# Patient Record
Sex: Male | Born: 1995 | Race: Asian | Hispanic: No | Marital: Married | Smoking: Never smoker
Health system: Southern US, Community
[De-identification: ages and names within clinical notes are randomized; demographics above are authoritative.]

---

## 2021-01-08 ENCOUNTER — Ambulatory Visit (INDEPENDENT_AMBULATORY_CARE_PROVIDER_SITE_OTHER): Payer: 59

## 2021-01-08 ENCOUNTER — Encounter (HOSPITAL_COMMUNITY): Payer: Self-pay | Admitting: Emergency Medicine

## 2021-01-08 ENCOUNTER — Ambulatory Visit (HOSPITAL_COMMUNITY)
Admission: EM | Admit: 2021-01-08 | Discharge: 2021-01-08 | Disposition: A | Discharge status: Home / Self Care | Payer: 59 | Attending: Medical Oncology | Admitting: Medical Oncology

## 2021-01-08 DIAGNOSIS — M79661 Pain in right lower leg: Secondary | ICD-10-CM

## 2021-01-08 DIAGNOSIS — M79662 Pain in left lower leg: Secondary | ICD-10-CM

## 2021-01-08 DIAGNOSIS — M25561 Pain in right knee: Secondary | ICD-10-CM

## 2021-01-08 LAB — BASIC METABOLIC PANEL
Anion gap: 12 (ref 5–15)
BUN: 16 mg/dL (ref 6–20)
CO2: 22 mmol/L (ref 22–32)
Calcium: 9.3 mg/dL (ref 8.9–10.3)
Chloride: 103 mmol/L (ref 98–111)
Creatinine, Ser: 0.78 mg/dL (ref 0.61–1.24)
GFR, Estimated: >60 mL/min (ref >60)
Glucose, Bld: 97 mg/dL (ref 70–99)
Potassium: 4 mmol/L (ref 3.5–5.1)
Sodium: 137 mmol/L (ref 135–145)

## 2021-01-08 LAB — CBC WITH DIFFERENTIAL/PLATELET
Abs Immature Granulocytes: 0 10*3/uL (ref 0.00–0.07)
Basophils Absolute: 0 10*3/uL (ref 0.0–0.1)
Basophils Relative: 1 %
Eosinophils Absolute: 0.1 10*3/uL (ref 0.0–0.5)
Eosinophils Relative: 1 %
HCT: 43.1 % (ref 39.0–52.0)
Hemoglobin: 13.7 g/dL (ref 13.0–17.0)
Immature Granulocytes: 0 %
Lymphocytes Relative: 34 %
Lymphs Abs: 2.3 10*3/uL (ref 0.7–4.0)
MCH: 25.9 pg — ABNORMAL LOW (ref 26.0–34.0)
MCHC: 31.8 g/dL (ref 30.0–36.0)
MCV: 81.6 fL (ref 80.0–100.0)
Monocytes Absolute: 0.7 10*3/uL (ref 0.1–1.0)
Monocytes Relative: 10 %
Neutro Abs: 3.6 10*3/uL (ref 1.7–7.7)
Neutrophils Relative %: 54 %
Platelets: 250 10*3/uL (ref 150–400)
RBC: 5.28 MIL/uL (ref 4.22–5.81)
RDW: 13.2 % (ref 11.5–15.5)
WBC: 6.6 10*3/uL (ref 4.0–10.5)
nRBC: 0 % (ref 0.0–0.2)

## 2021-01-08 LAB — URIC ACID: Uric Acid, Serum: 5.1 mg/dL (ref 3.7–8.6)

## 2021-01-08 MED ORDER — PREDNISONE 10 MG (21) PO TBPK: ORAL_TABLET | Freq: Every day | ORAL | 0 refills | Status: AC

## 2021-01-08 MED ORDER — COLCHICINE 0.6 MG PO TABS: ORAL_TABLET | ORAL | 0 refills | Status: DC

## 2021-01-08 NOTE — ED Triage Notes (Signed)
Pt presents with lower bilateral leg pain xs 2 months. States last night had severe pain in right knee. Denies any fall or injury.

## 2021-01-08 NOTE — ED Provider Notes (Signed)
[facility]    CSN: [id number] Arrival date & time: 01/08/21  1148      History   Chief Complaint Chief Complaint  Patient presents with  . Leg Pain  . Knee Pain    HPI Ryan Rodriguez is a 25 y.o. male.   HPI   Knee and Leg pain: Patient reports that he has had bilateral leg pain for many years.  The leg pain occurs around his shin area and goes from one leg to the other.  No known injury or cause.  He states that the real reason that he is here today is that last night his right knee started to cause significant pain.  No known injury.  Pain is located in the anterior and inferior knee.  He has noticed some mild increase in warmth, swelling, and tenderness to touch of the skin.  He denies any fevers, chills or any potential for an STI.  No calf pain chest pain or shortness of breath. He has not taken anything for symptoms but he has applied a salve to the knee which did help a little bit.   History reviewed. No pertinent past medical history.  There are no problems to display for this patient.   History reviewed. No pertinent surgical history.   Home Medications    Prior to Admission medications   Medication Sig Start Date End Date Taking? Authorizing Provider  predniSONE (STERAPRED UNI-PAK 21 TAB) 10 MG (21) TBPK tablet Take by mouth daily. Take 6 tabs by mouth daily  for 2 days, then 5 tabs for 2 days, then 4 tabs for 2 days, then 3 tabs for 2 days, 2 tabs for 2 days, then 1 tab by mouth daily for 2 days 01/08/21  Yes Ziyan Schoon M, PA-C  colchicine 0.6 MG tablet Take 2 tablets now by mouth. 1 hour late take one additional tablet. Take 1 tablet daily thereafter as needed until pain resolves for up to 5 days 01/08/21 01/08/21 Yes Rushie Chestnut, PA-C    Family History History reviewed. No pertinent family history.  Social History Social History   Tobacco Use  . Smoking status: Never Smoker  . Smokeless tobacco: Never Used  Substance Use Topics  .  Alcohol use: Never     Allergies   Patient has no known allergies.   Review of Systems Review of Systems  As stated above in HPI Physical Exam Triage Vital Signs ED Triage Vitals  Enc Vitals Group     BP 01/08/21 1329 116/74     Pulse Rate 01/08/21 1329 69     Resp 01/08/21 1329 16     Temp 01/08/21 1329 98.1 F (36.7 C)     Temp Source 01/08/21 1329 Oral     SpO2 01/08/21 1329 99 %     Weight --      Height --      Head Circumference --      Peak Flow --      Pain Score 01/08/21 1327 8     Pain Loc --      Pain Edu? --      Excl. in GC? --    No data found.  Updated Vital Signs BP 116/74 (BP Location: Right Arm)   Pulse 69   Temp 98.1 F (36.7 C) (Oral)   Resp 16   SpO2 99%   Physical Exam Vitals and nursing note reviewed.  Constitutional:      General: He is not in  acute distress.    Appearance: Normal appearance. He is not ill-appearing, toxic-appearing or diaphoretic.  Cardiovascular:     Pulses: Normal pulses.  Musculoskeletal:     Comments: Mild edema of the right knee with mild increase of warmth and tenderness to light palpation especially inferiorly.  Mild tenderness to varus and valgus stress testing but without any abnormalities in movement.  Normal anterior and posterior drawer testing.  No sign of any skin breakdown or erythema.  No bony abnormalities of the bilateral knees or legs  Skin:    General: Skin is warm.     Capillary Refill: Capillary refill takes less than 2 seconds.     Findings: No bruising or rash.  Neurological:     Mental Status: He is alert.     Sensory: No sensory deficit.     Motor: No weakness.     Gait: Gait abnormal (slow).      UC Treatments / Results  Labs (all labs ordered are listed, but only abnormal results are displayed) Labs Reviewed  CBC WITH DIFFERENTIAL/PLATELET  URIC ACID  BASIC METABOLIC PANEL    EKG   Radiology DG Tibia/Fibula Left  Result Date: 01/08/2021 CLINICAL DATA:  Comparison film  for the right tibia and fibula. EXAM: LEFT TIBIA AND FIBULA - 2 VIEW COMPARISON:  None. FINDINGS: No acute fracture or dislocation is noted. No lucent area to correspond with that seen in the right tibia is noted. No soft tissue changes are seen. IMPRESSION: No acute abnormality noted. Electronically Signed   By: Alcide Clever M.D.   On: 01/08/2021 15:41   DG Tibia/Fibula Right  Result Date: 01/08/2021 CLINICAL DATA:  Lower leg pain for several months, no known injury, initial encounter EXAM: RIGHT TIBIA AND FIBULA - 2 VIEW COMPARISON:  None. FINDINGS: No acute fracture or dislocation is noted. Previously seen lucent region in the lateral aspect of the proximal tibia is again identified and stable. No soft tissue abnormality is seen. IMPRESSION: Lucency in the lateral aspect of the proximal tibia. 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 MRI is recommended on a nonemergent basis for further evaluation as previously described. Electronically Signed   By: Alcide Clever M.D.   On: 01/08/2021 15:40   DG Knee AP/LAT W/Sunrise Right  Result Date: 01/08/2021 CLINICAL DATA:  Pain EXAM: RIGHT KNEE 3 VIEWS COMPARISON:  None. FINDINGS: No acute fracture or dislocation. Joint spaces and alignment are maintained. There is an eccentrically located lucent lesion along the lateral and posterior aspect of the proximal tibia with a thin sclerotic margin medially. The lateral margin is mildly irregular in appearance. No unexpected radiopaque foreign body. Soft tissues are unremarkable. IMPRESSION: Lucent lesion of the proximal tibia may reflect a benign fibro-osseous lesion such as a nonossifying fibroma. However given mildly irregular lateral margin and history of pain, recommend further evaluation with dedicated MRI. Electronically Signed   By: Meda Klinefelter MD   On: 01/08/2021 15:10    Procedures Procedures (including critical care time)  Medications Ordered in UC Medications - No data to display  Initial  Impression / Assessment and Plan / UC Course  I have reviewed the triage vital signs and the nursing notes.  Pertinent labs & imaging results that were available during my care of the patient were reviewed by me and considered in my medical decision making (see chart for details).     New.  Discussed his x-ray finding of his knee with patient and we will obtain further imaging of  his lower legs.  Originally thought that this could be gout potentially as he has had recent diet change so we are going to obtain a uric acid, BMP and CBC level.  If his CBC level shows a significantly elevated white blood cell count I am going to refer him to the ER for work-up for septic arthritis though the risk is low as he has never had any surgeries or procedures on this area, does not have any fever or flulike symptoms and has only had 1 sexual partner.  We discussed after the x-ray finding that I am switching the colchicine to prednisone.  Discussed how to use along with common potential side effects and precautions. Discussed red flag signs and symptoms and the need for orthopedic follow up within 2 weeks.  He states that he has an orthopedic in mind that he will contact on Tuesday. Final Clinical Impressions(s) / UC Diagnoses   Final diagnoses:  Acute pain of right knee   Discharge Instructions   None    ED Prescriptions    Medication Sig Dispense Auth. Provider   colchicine 0.6 MG tablet  (Status: Discontinued) Take 2 tablets now by mouth. 1 hour late take one additional tablet. Take 1 tablet daily thereafter as needed until pain resolves for up to 5 days 8 tablet Tejas Seawood M, PA-C   predniSONE (STERAPRED UNI-PAK 21 TAB) 10 MG (21) TBPK tablet Take by mouth daily. Take 6 tabs by mouth daily  for 2 days, then 5 tabs for 2 days, then 4 tabs for 2 days, then 3 tabs for 2 days, 2 tabs for 2 days, then 1 tab by mouth daily for 2 days 42 tablet Zayaan Kozak, Brand Males, [place]     PDMP not reviewed this  encounter.   Rushie Chestnut, [place] 01/08/21 1557

## 2021-07-10 ENCOUNTER — Other Ambulatory Visit (HOSPITAL_BASED_OUTPATIENT_CLINIC_OR_DEPARTMENT_OTHER): Payer: Self-pay

## 2021-07-10 ENCOUNTER — Other Ambulatory Visit: Payer: Self-pay

## 2021-07-10 ENCOUNTER — Ambulatory Visit (HOSPITAL_BASED_OUTPATIENT_CLINIC_OR_DEPARTMENT_OTHER)
Admission: RE | Admit: 2021-07-10 | Discharge: 2021-07-10 | Disposition: A | Discharge status: Home / Self Care | Payer: 59 | Source: Ambulatory Visit | Attending: Orthopaedic Surgery | Admitting: Orthopaedic Surgery

## 2021-07-10 ENCOUNTER — Ambulatory Visit (INDEPENDENT_AMBULATORY_CARE_PROVIDER_SITE_OTHER): Payer: 59 | Admitting: Orthopaedic Surgery

## 2021-07-10 ENCOUNTER — Other Ambulatory Visit (HOSPITAL_BASED_OUTPATIENT_CLINIC_OR_DEPARTMENT_OTHER): Payer: Self-pay | Admitting: Orthopaedic Surgery

## 2021-07-10 DIAGNOSIS — M79661 Pain in right lower leg: Secondary | ICD-10-CM | POA: Insufficient documentation

## 2021-07-10 DIAGNOSIS — M25561 Pain in right knee: Secondary | ICD-10-CM | POA: Diagnosis not present

## 2021-07-10 DIAGNOSIS — G8929 Other chronic pain: Secondary | ICD-10-CM | POA: Diagnosis not present

## 2021-07-10 MED ORDER — MELOXICAM 15 MG PO TABS
15.0000 mg | ORAL_TABLET | Freq: Every day | ORAL | 0 refills | Status: DC
  Filled 2021-07-10: qty 30, 30d supply, fill #0

## 2021-07-10 NOTE — Progress Notes (Signed)
Chief Complaint: right leg pain     History of Present Illness:    Ryan Rodriguez is a 25 y.o. male with right leg pain now going on for over 2 years.  He states that this is worsened when he landed on the right knee against his truck a week prior.  Since that time he has had significant pain and swelling about the lateral aspect of the proximal tibia but down the leg further.  He states that this pain has been occurring insidiously over time and happen without any specific trauma.  This is waking him up at night.  He does drive a semitruck and the pain is significantly been interfering with that.  Denies any history of pain prior to 2 years ago.    Surgical History:   None  PMH/PSH/Family History/Social History/Meds/Allergies:   No past medical history on file. No past surgical history on file. Social History   Socioeconomic History   Marital status: Married    Spouse name: Not on file   Number of children: Not on file   Years of education: Not on file   Highest education level: Not on file  Occupational History   Not on file  Tobacco Use   Smoking status: Never   Smokeless tobacco: Never  Substance and Sexual Activity   Alcohol use: Never   Drug use: Not on file   Sexual activity: Not on file  Other Topics Concern   Not on file  Social History Narrative   Not on file   Social Determinants of Health   Financial Resource Strain: Not on file  Food Insecurity: Not on file  Transportation Needs: Not on file  Physical Activity: Not on file  Stress: Not on file  Social Connections: Not on file   No family history on file. No Known Allergies Current Outpatient Medications  Medication Sig Dispense Refill   meloxicam (MOBIC) 15 MG tablet Take 1 tablet (15 mg total) by mouth daily. 30 tablet 0   predniSONE (STERAPRED UNI-PAK 21 TAB) 10 MG (21) TBPK tablet Take by mouth daily. Take 6 tabs by mouth daily  for 2 days, then 5 tabs for 2 days,  then 4 tabs for 2 days, then 3 tabs for 2 days, 2 tabs for 2 days, then 1 tab by mouth daily for 2 days 42 tablet 0   No current facility-administered medications for this visit.   No results found.  Review of Systems:   A ROS was performed including pertinent positives and negatives as documented in the HPI.  Physical Exam :   Constitutional: NAD and appears stated age Neurological: Alert and oriented Psych: Appropriate affect and cooperative There were no vitals taken for this visit.   Comprehensive Musculoskeletal Exam:    Has swollen and tender about the proximal tibia.  Knee range of motion is from 0 to 135 degrees without pain.  No joint line tenderness.  Negative Lachman.  Negative posterior drawer.  No laxity with varus or valgus stress.  Tenderness with squeeze about the mid aspect of the tibia  Imaging:   Xray (4 views right knee, 2 views right tibia): There is a mixed appearing lesion of the proximal tibia that is localized to the bone, this is seen previously and may x-rays although there does appear to be  expansion of the lesion   I personally reviewed and interpreted the radiographs.   Assessment:   25 year old male with a right proximal tibial lesion in the setting of pain over the course of the last 2 years.  This is waking him up at night.  I have advised that I would like to get a stat MRI with contrast to further characterize this lesion.  We discussed that based on the findings of this I would plan to refer him to orthopedic oncologist for possible bone biopsy and further assessment.  Plan :    -He will return to clinic following MRI so we can discuss results   I personally saw and evaluated the patient, and participated in the management and treatment plan.  Huel Cote, MD Attending Physician, Orthopedic Surgery  This document was dictated using Dragon voice recognition software. A reasonable attempt at proof reading has been made to minimize  errors.

## 2021-07-11 ENCOUNTER — Other Ambulatory Visit (HOSPITAL_BASED_OUTPATIENT_CLINIC_OR_DEPARTMENT_OTHER): Payer: Self-pay

## 2021-07-14 ENCOUNTER — Other Ambulatory Visit: Payer: Self-pay

## 2021-07-14 ENCOUNTER — Ambulatory Visit
Admission: RE | Admit: 2021-07-14 | Discharge: 2021-07-14 | Disposition: A | Discharge status: Home / Self Care | Payer: 59 | Source: Ambulatory Visit | Attending: Orthopaedic Surgery | Admitting: Orthopaedic Surgery

## 2021-07-14 DIAGNOSIS — G8929 Other chronic pain: Secondary | ICD-10-CM

## 2021-07-14 DIAGNOSIS — M25561 Pain in right knee: Secondary | ICD-10-CM

## 2021-07-14 MED ORDER — GADOBENATE DIMEGLUMINE 529 MG/ML IV SOLN
12.0000 mL | Freq: Once | INTRAVENOUS | Status: AC | PRN
  Administered 2021-07-14: 12 mL via INTRAVENOUS

## 2021-07-14 NOTE — Addendum Note (Signed)
Addended by: Kerby Less A on: 07/14/2021 12:34 PM   Modules accepted: Orders

## 2021-07-19 ENCOUNTER — Telehealth: Payer: Self-pay | Admitting: Orthopaedic Surgery

## 2021-07-19 NOTE — Telephone Encounter (Signed)
Pt called and is wondering the address and name of place you are suppose to be sending his referral to?   CB [phone]

## 2021-07-19 NOTE — Telephone Encounter (Signed)
LMOM and explained we sent the referral to [facility] as an external referral and their address is [address] [place], [place], [place] [address]

## 2021-07-19 NOTE — Telephone Encounter (Signed)
Pt called requesting a call back concerning a referral sent to specialist. Pt would like the info to where the referral ws sent to. Looked at referral did not see updated info. Please call pt at [phone].

## 2021-07-20 ENCOUNTER — Telehealth: Payer: Self-pay | Admitting: Orthopaedic Surgery

## 2021-07-20 ENCOUNTER — Other Ambulatory Visit (HOSPITAL_BASED_OUTPATIENT_CLINIC_OR_DEPARTMENT_OTHER): Payer: Self-pay | Admitting: Orthopaedic Surgery

## 2021-07-20 DIAGNOSIS — M25561 Pain in right knee: Secondary | ICD-10-CM

## 2021-07-20 NOTE — Telephone Encounter (Signed)
Dr. Steward Drone spoke to patient about other oncologists for referral as [facility] pt's insurance.

## 2021-07-20 NOTE — Telephone Encounter (Signed)
Pt called but phone dropped while pt was on hold while trying to find information. Pt wanted to find out who the referral was sent to specifically and when pt called [facility] he was told they did not take his ins so unsure of why Dr. Steward Drone told him he can be referred with no problem. Pt states the pain is intense at night and unsure of what to do. Forwarding this message to Victoria to see what next steps for pt are and will let her call pt back to discuss. The best call back number is [phone].

## 2021-07-20 NOTE — Telephone Encounter (Signed)
[facility] does not accept*

## 2021-07-20 NOTE — Telephone Encounter (Signed)
Called patient back and pt spoke directly with Dr. Steward Drone. Dr. Steward Drone stated he will call orthopedic oncologists to see who is in pt's insurance network.

## 2021-07-20 NOTE — Telephone Encounter (Signed)
This pt has called several times fro Dr. Steward Drone office about a referral . Pt states his call dropped. Please call pt at [phone].

## 2021-07-24 ENCOUNTER — Telehealth: Payer: Self-pay | Admitting: Orthopaedic Surgery

## 2021-07-24 NOTE — Telephone Encounter (Signed)
Spoke to patient about referral to [facility] and explained it was placed 12/8

## 2021-07-24 NOTE — Telephone Encounter (Signed)
Sent message to Elvina Mattes asking for update on referral

## 2021-07-24 NOTE — Telephone Encounter (Signed)
Pt called stating he was supposed to have a referral sent to [facility] but he hasn't heard anything. Pt would like a CB to make sure the referral was sent please.   [phone]

## 2021-07-31 ENCOUNTER — Ambulatory Visit (HOSPITAL_BASED_OUTPATIENT_CLINIC_OR_DEPARTMENT_OTHER)
Admission: RE | Admit: 2021-07-31 | Discharge: 2021-07-31 | Disposition: A | Discharge status: Home / Self Care | Payer: 59 | Source: Ambulatory Visit | Attending: Family Medicine | Admitting: Family Medicine

## 2021-07-31 ENCOUNTER — Ambulatory Visit: Payer: Self-pay

## 2021-07-31 ENCOUNTER — Ambulatory Visit: Payer: 59 | Admitting: Family Medicine

## 2021-07-31 ENCOUNTER — Ambulatory Visit (HOSPITAL_BASED_OUTPATIENT_CLINIC_OR_DEPARTMENT_OTHER): Payer: 59 | Admitting: Orthopaedic Surgery

## 2021-07-31 ENCOUNTER — Other Ambulatory Visit: Payer: Self-pay

## 2021-07-31 VITALS — BP 122/80 | Ht 66.0 in | Wt 136.0 lb

## 2021-07-31 DIAGNOSIS — M79661 Pain in right lower leg: Secondary | ICD-10-CM

## 2021-07-31 MED ORDER — DICLOFENAC SODIUM 75 MG PO TBEC: 75.0000 mg | DELAYED_RELEASE_TABLET | Freq: Two times a day (BID) | ORAL | 1 refills | Status: AC

## 2021-07-31 MED ORDER — HYDROCODONE-ACETAMINOPHEN 5-325 MG PO TABS: 1.0000 | ORAL_TABLET | Freq: Four times a day (QID) | ORAL | 0 refills | Status: AC | PRN

## 2021-07-31 NOTE — Patient Instructions (Addendum)
Get the doppler ultrasound of your leg - this is very important to assess if you have a blood clot that is causing this severe pain. Stop the meloxicam. Take diclofenac 75mg  twice a day with food for pain and inflammation. Norco as needed for severe pain. If the ultrasound is normal, I want you to start doing simple motion exercises to work the spasm out in the calf muscle. Follow up will depend on the ultrasound results.  Please go to the [facility] for the doppler ultrasound [address] [place], [place] When you arrive and get off the elevator go to the right. Look for [address] to check in. Appt: 07/31/21 at 5:30 pm

## 2021-08-01 ENCOUNTER — Ambulatory Visit (HOSPITAL_COMMUNITY)
Admission: RE | Admit: 2021-08-01 | Discharge: 2021-08-01 | Disposition: A | Discharge status: Home / Self Care | Payer: 59 | Source: Ambulatory Visit | Attending: Family Medicine | Admitting: Family Medicine

## 2021-08-01 ENCOUNTER — Encounter (HOSPITAL_COMMUNITY): Payer: Self-pay

## 2021-08-01 ENCOUNTER — Encounter: Payer: Self-pay | Admitting: Family Medicine

## 2021-08-01 DIAGNOSIS — M79661 Pain in right lower leg: Secondary | ICD-10-CM | POA: Diagnosis present

## 2021-08-01 MED ORDER — IOHEXOL 350 MG/ML SOLN
80.0000 mL | Freq: Once | INTRAVENOUS | Status: AC | PRN
  Administered 2021-08-01: 18:00:00 80 mL via INTRAVENOUS

## 2021-08-01 NOTE — Progress Notes (Signed)
PCP: Pcp, No  Subjective:   HPI: Patient is a 25 y.o. male here for right calf pain.  Patient reports for about 2 days he's had worsening lateral proximal calf pain. Pain worse with flexion and extension of the knee. Of note has seen [facility] for knee pain about 3 weeks ago, found by MRI to have a lesion in his right tibia - due to see [facility] oncology on 12/21 for consultation, possible biopsy. In meantime he has been walking around using crutches.  History reviewed. No pertinent past medical history.  Current Outpatient Medications on File Prior to Visit  Medication Sig Dispense Refill   predniSONE (STERAPRED UNI-PAK 21 TAB) 10 MG (21) TBPK tablet Take by mouth daily. Take 6 tabs by mouth daily  for 2 days, then 5 tabs for 2 days, then 4 tabs for 2 days, then 3 tabs for 2 days, 2 tabs for 2 days, then 1 tab by mouth daily for 2 days 42 tablet 0   [DISCONTINUED] colchicine 0.6 MG tablet Take 2 tablets now by mouth. 1 hour late take one additional tablet. Take 1 tablet daily thereafter as needed until pain resolves for up to 5 days 8 tablet 0   No current facility-administered medications on file prior to visit.    History reviewed. No pertinent surgical history.  No Known Allergies  BP 122/80    Ht 5\' 6"  (1.676 m)    Wt 136 lb (61.7 kg)    BMI 21.95 kg/m   No flowsheet data found.  No flowsheet data found.      Objective:  Physical Exam:  Gen: NAD, comfortable in exam room  Right lower leg: Mild swelling posterolateral lower leg proximally compared to left leg.  No other deformity, bruising. FROM with 5/5 strength but pain with full extension and flexion of the knee. Tenderness to palpation lateral proximal gastroc.  No other tenderness. NVI distally with 2+ pt pulse, < 2 sec cap refill.  Limited MSK u/s right lower leg:  Mild edema surrounding lateral gastroc proximally.  Popliteal vein compressible and no filling defect.    Assessment & Plan:  1. Right calf pain - in  setting of relative immobility, questionable malignancy (due to see oncology 12/21).  Concern for DVT - limited MSK u/s here reassuring but will order formal doppler u/s.  Stop meloxicam as this hasn't been helping.  Try diclofenac with norco as needed.  If ultrasound normal start some simple calf motion exercises.

## 2021-08-02 ENCOUNTER — Ambulatory Visit: Payer: 59 | Admitting: Family Medicine

## 2021-08-04 ENCOUNTER — Telehealth: Payer: Self-pay | Admitting: Family Medicine

## 2021-08-04 ENCOUNTER — Ambulatory Visit (INDEPENDENT_AMBULATORY_CARE_PROVIDER_SITE_OTHER): Payer: 59 | Admitting: Family Medicine

## 2021-08-04 VITALS — BP 104/70 | Ht 66.0 in | Wt 136.0 lb

## 2021-08-04 DIAGNOSIS — M86661 Other chronic osteomyelitis, right tibia and fibula: Secondary | ICD-10-CM | POA: Diagnosis not present

## 2021-08-04 NOTE — Assessment & Plan Note (Signed)
Acute on chronic right lower leg pain.  Has swelling and pain intermittently.  Has a bone biopsy scheduled for January 6.  Does get improvement with the diclofenac.  Recent studies do not indicate a venous clot or arterial changes. -Counseled on home exercise therapy and supportive care. -Counseled on compression. -Counseled diclofenac.

## 2021-08-04 NOTE — Telephone Encounter (Signed)
Pt cld states saw Dr. Pearletha Forge 12/19 for R knee/leg issue.,says now Pain not reduce & now it leg/knee appears to be swelling.  --Pt request someone call him back to discuss & or if a Rx can be called in to reduce swelling.  --Ph# [phone]  --glh

## 2021-08-04 NOTE — Progress Notes (Signed)
°  Ryan Rodriguez - 25 y.o. male MRN [id number]  Date of birth: 05/03/1996  SUBJECTIVE:  Including CC & ROS.  No chief complaint on file.   Ryan Rodriguez is a 25 y.o. male that is presenting with right leg pain and swelling. His pain is acute on chronic in nature. Has been taking diclofenac.  Reviewed the CT angio of the right lower extremity from 12/20 shows 3 Changes in the right tibia suspicious for subacute or chronic osteomyelitis. Review of the lower extremity Doppler on 12/19 shows no acute changes.  Independent review of the right tibia and fibula from 12/2 shows abnormality of the proximal right tibia.  Review of Systems See HPI   HISTORY: Past Medical, Surgical, Social, and Family History Reviewed & Updated per EMR.   Pertinent Historical Findings include:  No past medical history on file.  No past surgical history on file.  No family history on file.  Social History   Socioeconomic History   Marital status: Married    Spouse name: Not on file   Number of children: Not on file   Years of education: Not on file   Highest education level: Not on file  Occupational History   Not on file  Tobacco Use   Smoking status: Never   Smokeless tobacco: Never  Substance and Sexual Activity   Alcohol use: Never   Drug use: Not on file   Sexual activity: Not on file  Other Topics Concern   Not on file  Social History Narrative   Not on file   Social Determinants of Health   Financial Resource Strain: Not on file  Food Insecurity: Not on file  Transportation Needs: Not on file  Physical Activity: Not on file  Stress: Not on file  Social Connections: Not on file  Intimate Partner Violence: Not on file     PHYSICAL EXAM:  VS: BP 104/70    Ht 5\' 6"  (1.676 m)    Wt 136 lb (61.7 kg)    BMI 21.95 kg/m  Physical Exam Gen: NAD, alert, cooperative with exam, well-appearing    ASSESSMENT & PLAN:   Chronic osteomyelitis of right tibia (HCC) Acute on chronic right lower  leg pain.  Has swelling and pain intermittently.  Has a bone biopsy scheduled for January 6.  Does get improvement with the diclofenac.  Recent studies do not indicate a venous clot or arterial changes. -Counseled on home exercise therapy and supportive care. -Counseled on compression. -Counseled diclofenac.

## 2021-08-04 NOTE — Patient Instructions (Signed)
Nice to meet you Please consider compression  Please try heat  Please send a my chart picture if anything changes   Please send me a message in MyChart with any questions or updates.  Please see me back as needed.   --Dr. Jordan Likes

## 2021-08-13 ENCOUNTER — Encounter: Payer: Self-pay | Admitting: Family Medicine

## 2022-07-13 IMAGING — US US EXTREM LOW VENOUS*R*
1 series · 13 of 24 positions shown · non-contrast
Comparison: None.

CLINICAL DATA: Right calf pain

EXAM:
RIGHT LOWER EXTREMITY VENOUS DOPPLER ULTRASOUND
TECHNIQUE: Gray-scale sonography with compression, as well as color and duplex
ultrasound, were performed to evaluate the deep venous system(s)
from the level of the common femoral vein through the popliteal and
proximal calf veins.

[Series 1: us extrem low venous*right* · 42 acquisitions, 13 frames shown]
[im 1/42]
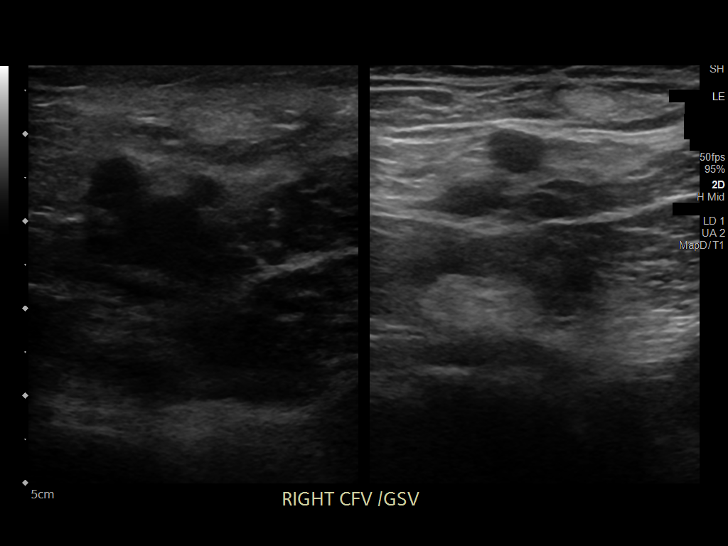
[im 4/42]
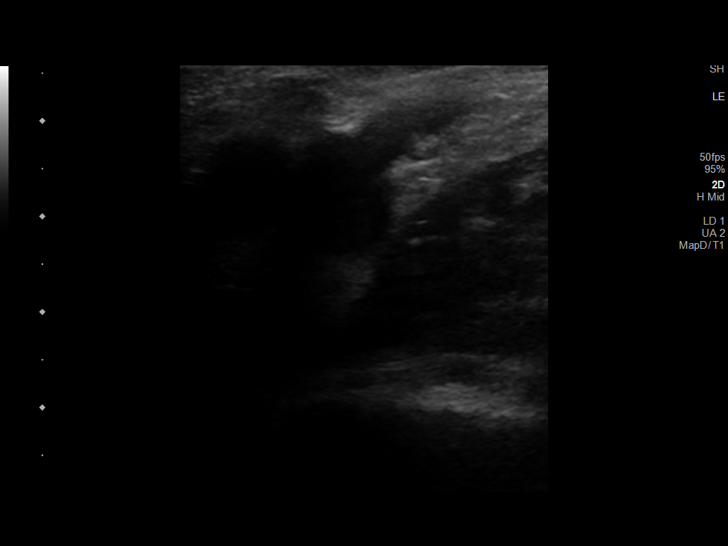
[im 9/42]
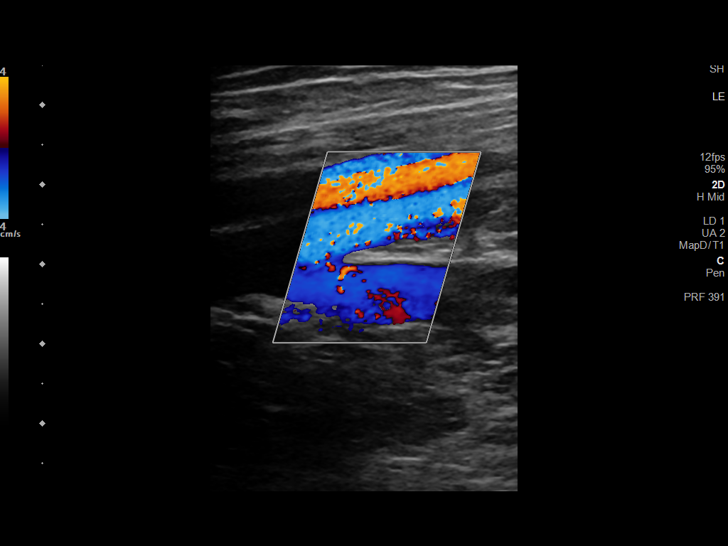
[im 13/42]
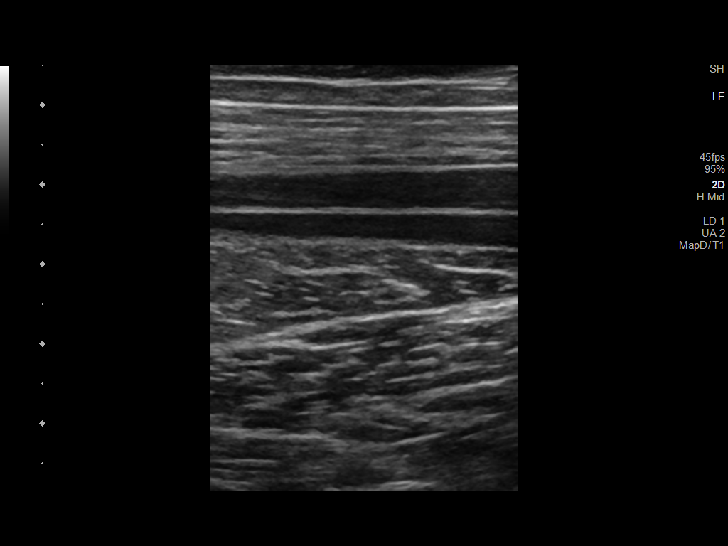
[im 17/42]
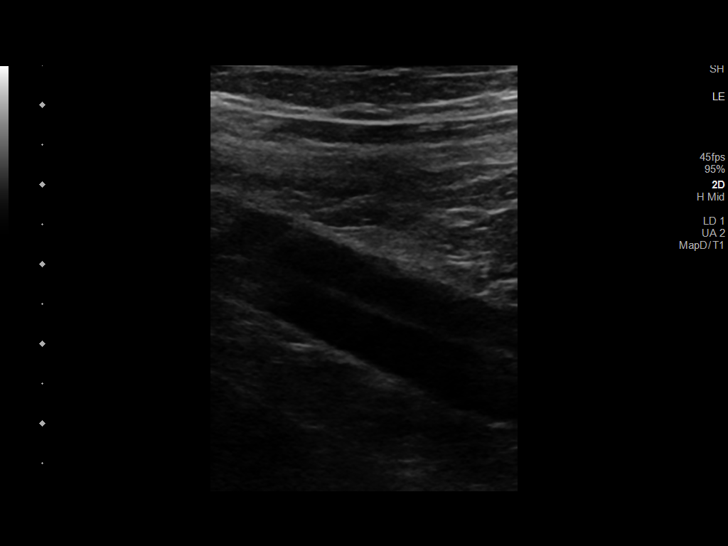
[im 20/42]
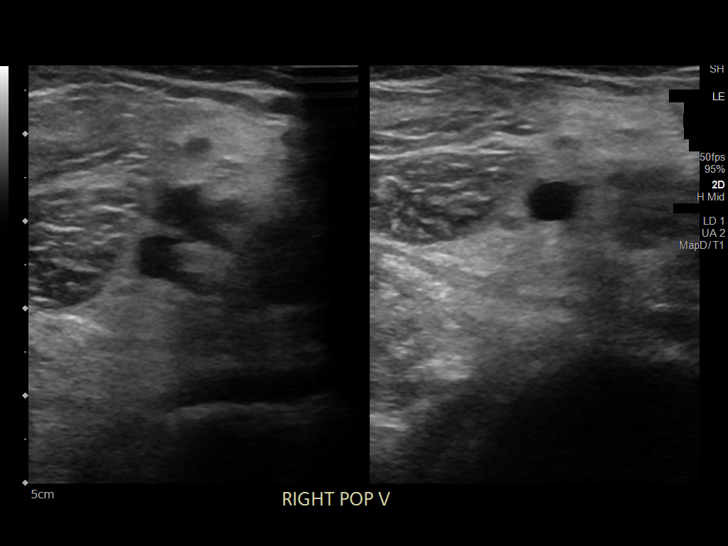
[im 24/42]
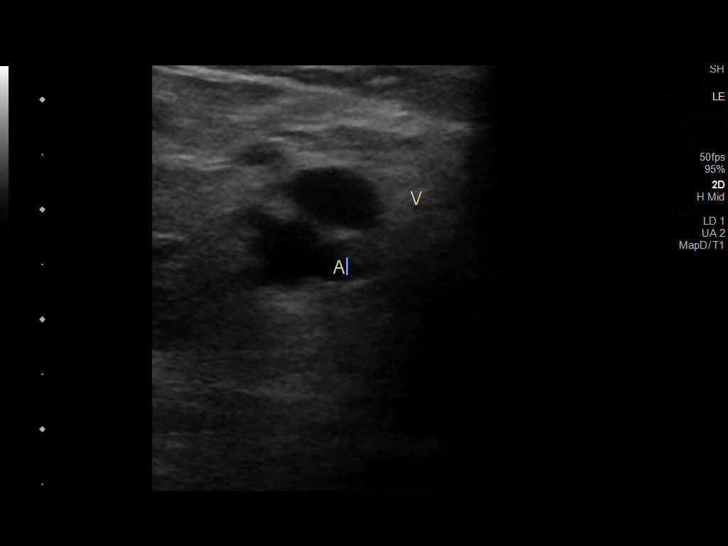
[im 25/42]
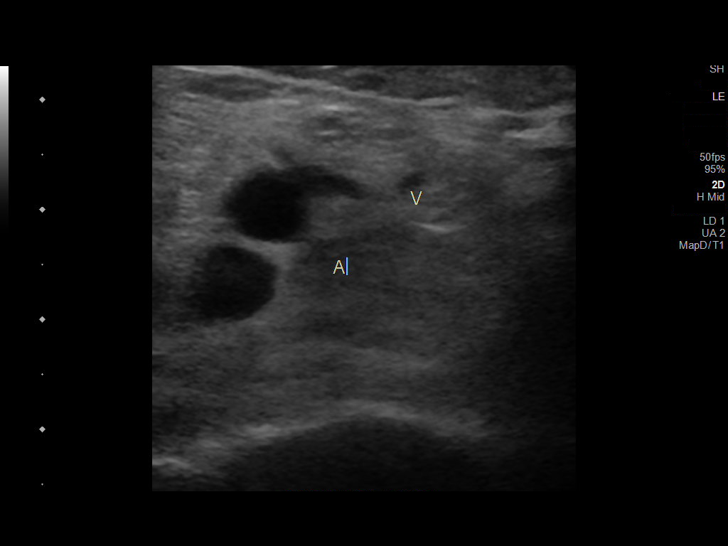
[im 27/42]
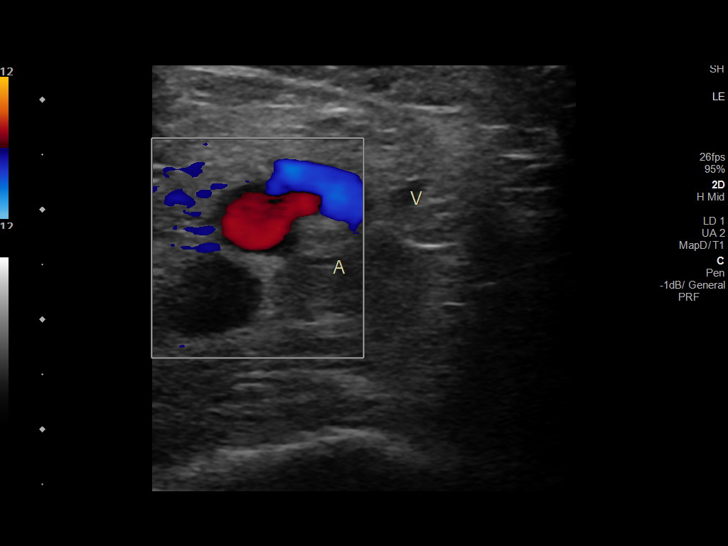
[im 31/42]
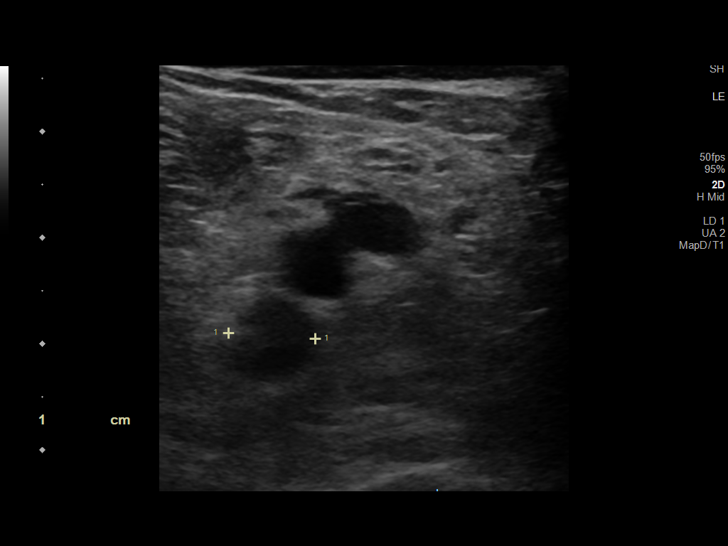
[im 33/42]
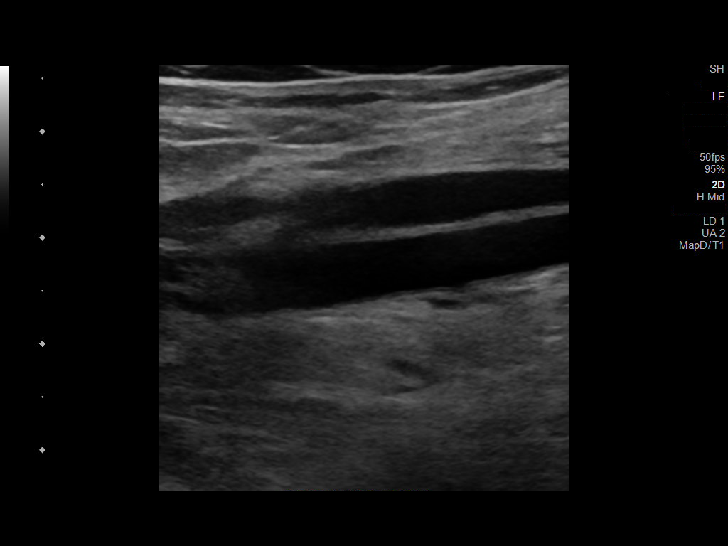
[im 38/42]
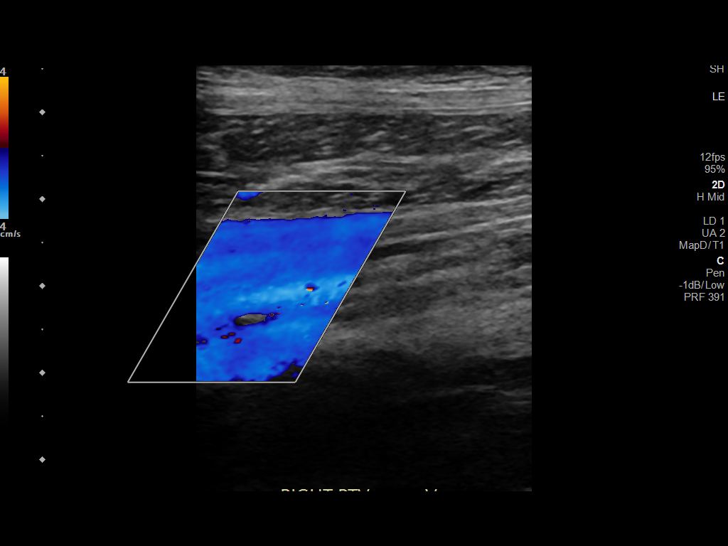
[im 42/42]
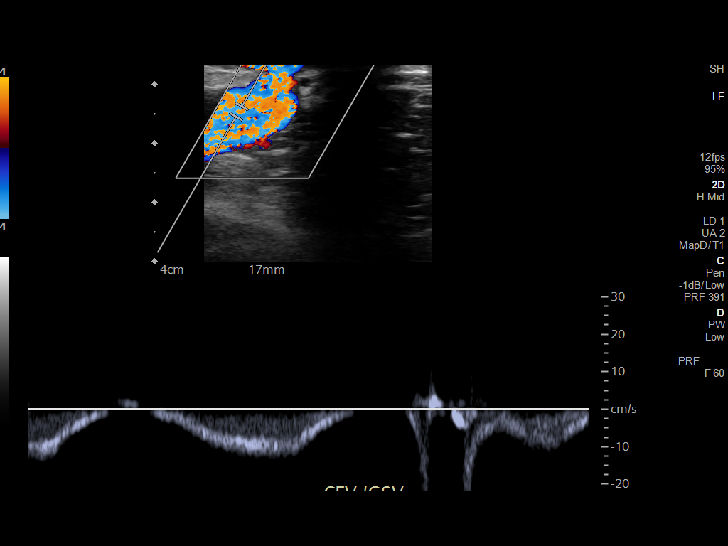

[13 of 24 positions shown; findings below may reference images not displayed]

FINDINGS: VENOUS

Normal compressibility of the common femoral, superficial femoral,
and popliteal veins, as well as the visualized calf veins.
Visualized portions of profunda femoral vein and great saphenous
vein unremarkable. No filling defects to suggest DVT on grayscale or
color Doppler imaging. Doppler waveforms show normal direction of
venous flow, normal respiratory plasticity and response to
augmentation.

Limited views of the contralateral common femoral vein are
unremarkable.

OTHER

There is a 1.1 x 0.6 x 0.8 cm cystic focus along the posterior
margin of the right popliteal artery without internal vascularity on
color Doppler.

Limitations: none
IMPRESSION: 1. No evidence of deep venous thrombosis in the right lower
extremity.
2. Small 1.1 cm cystic focus along the posterior margin of the right
popliteal artery without internal vascularity on color Doppler,
nonspecific, cannot exclude cystic adventitial disease of the right
popliteal artery. Suggest vascular surgical consultation and CT
angiogram with IV contrast of the right lower extremity.

## 2022-07-14 IMAGING — CT CT ANGIO EXTREM LOW*R*
2 of 5 series · 9 of 46 positions shown, 10 images · IV contrast (APPLIED)
Comparison: Prior ultrasound from 07/31/2021 as well as an MRI of
the right lower extremity on the 07/14/21

CLINICAL DATA: Small focus adjacent to the popliteal artery on
recent duplex examination.

EXAM:
CT ANGIOGRAPHY OF THE RIGHT LOWEREXTREMITY
TECHNIQUE: Multidetector CT imaging of the right lower extremitywas performed
using the standard protocol during bolus administration of
intravenous contrast. Multiplanar CT image reconstructions and MIPs
were obtained to evaluate the vascular anatomy.
CONTRAST:  80mL OMNIPAQUE IOHEXOL 350 MG/ML SOLN

[Series 4: axial arterial upper · axial · arterial · 0.45mm/px · z∈[+214,+1172]mm · 6 of 397 slices shown, 7 images]
[im 39/397  soft-tissue]
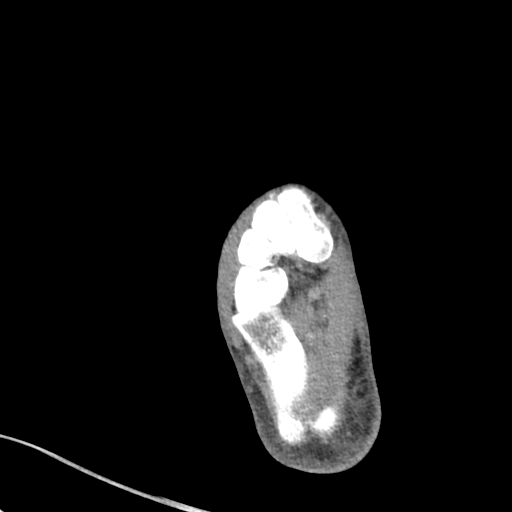
[im 39/397  bone]
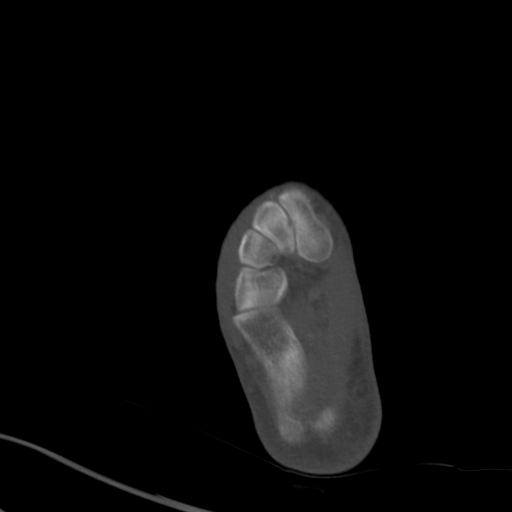
[im 103/397  soft-tissue]
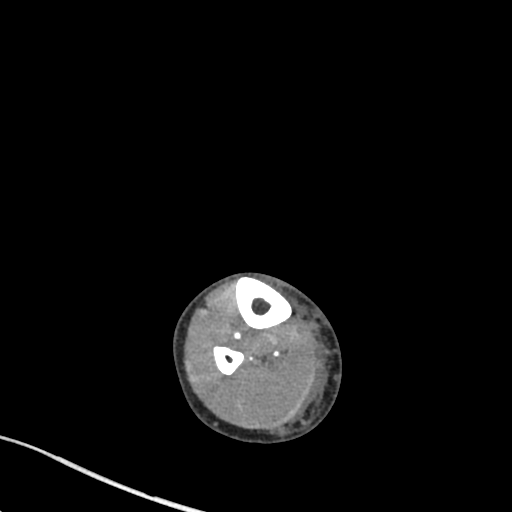
[im 167/397  soft-tissue]
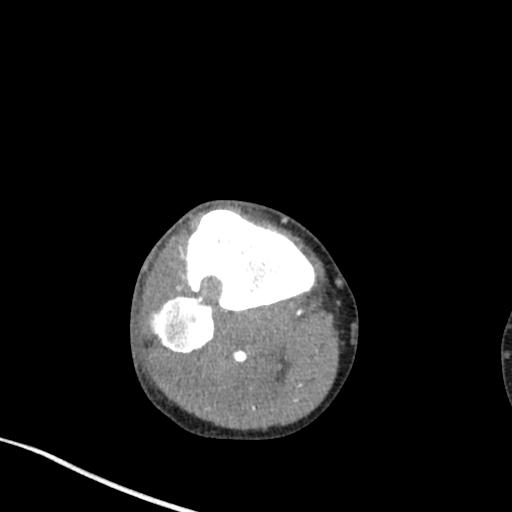
[im 230/397  soft-tissue]
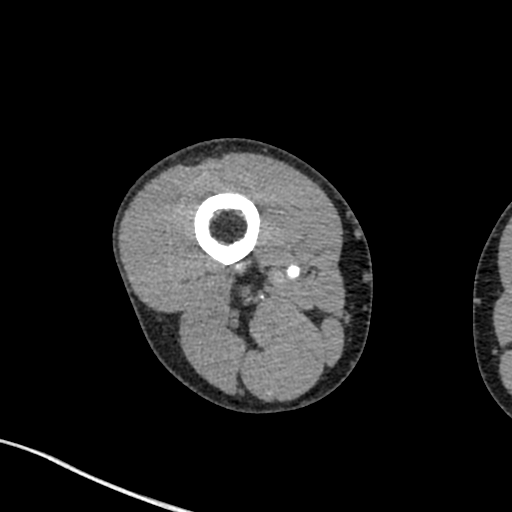
[im 294/397  soft-tissue]
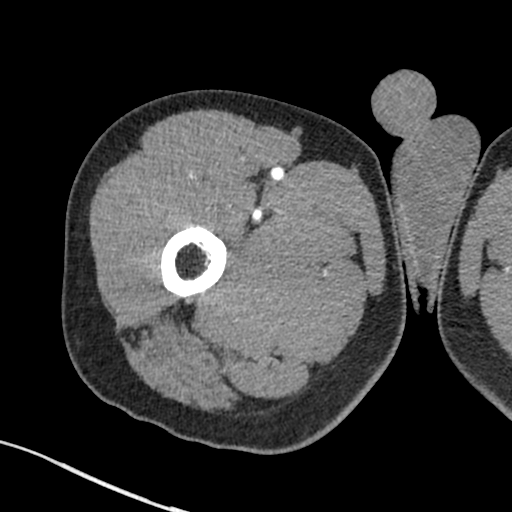
[im 358/397  soft-tissue]
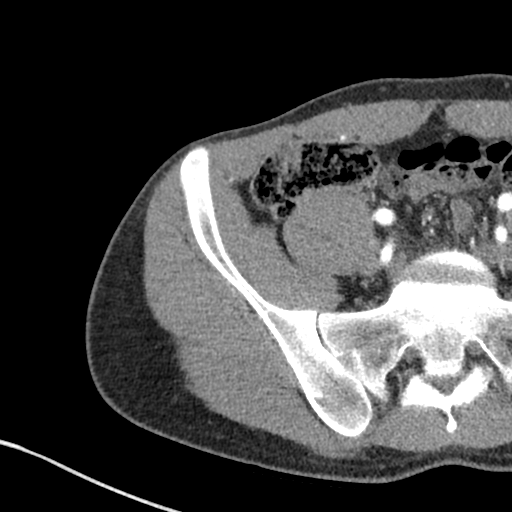

[Series 5: coronal upper · coronal · 0.63mm/px · 3 of 135 slices shown]
[im 34/135  soft-tissue]
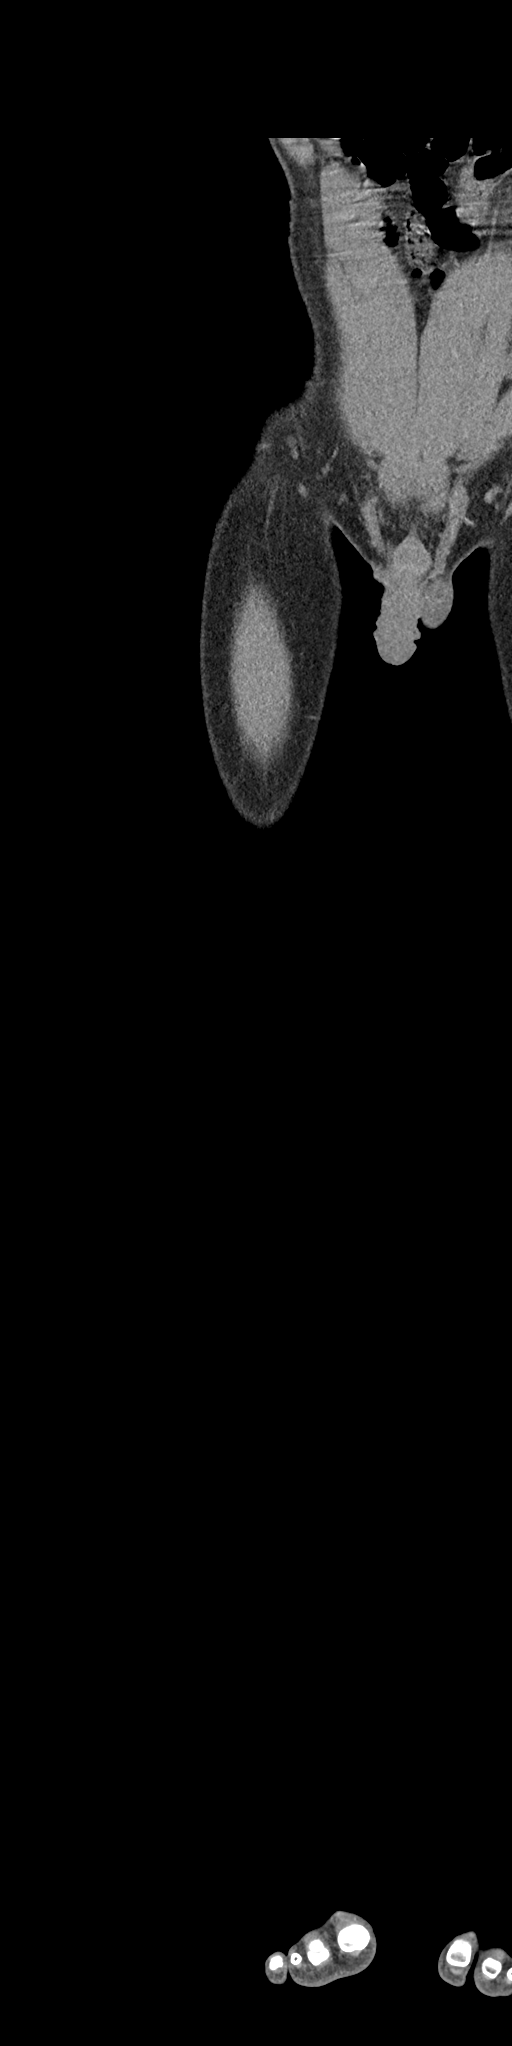
[im 68/135  soft-tissue]
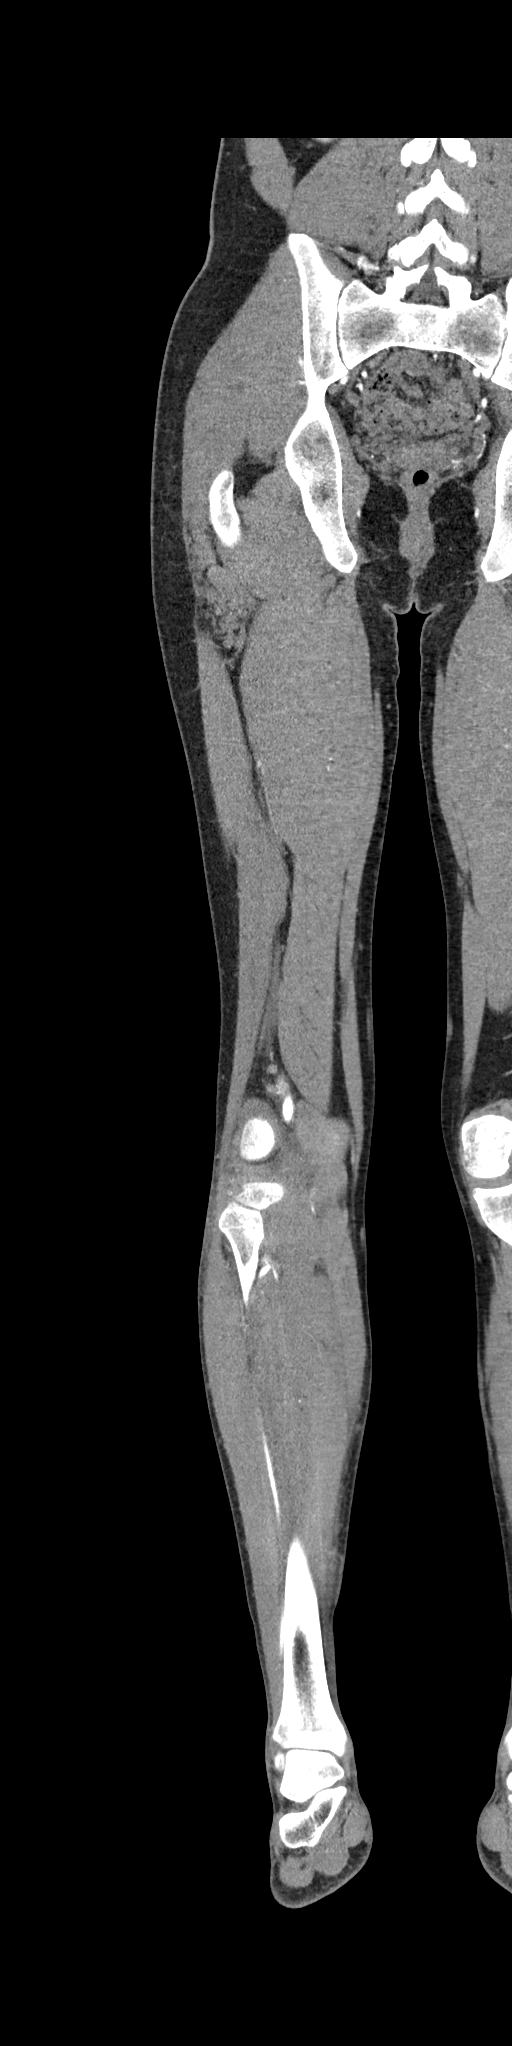
[im 101/135  soft-tissue]
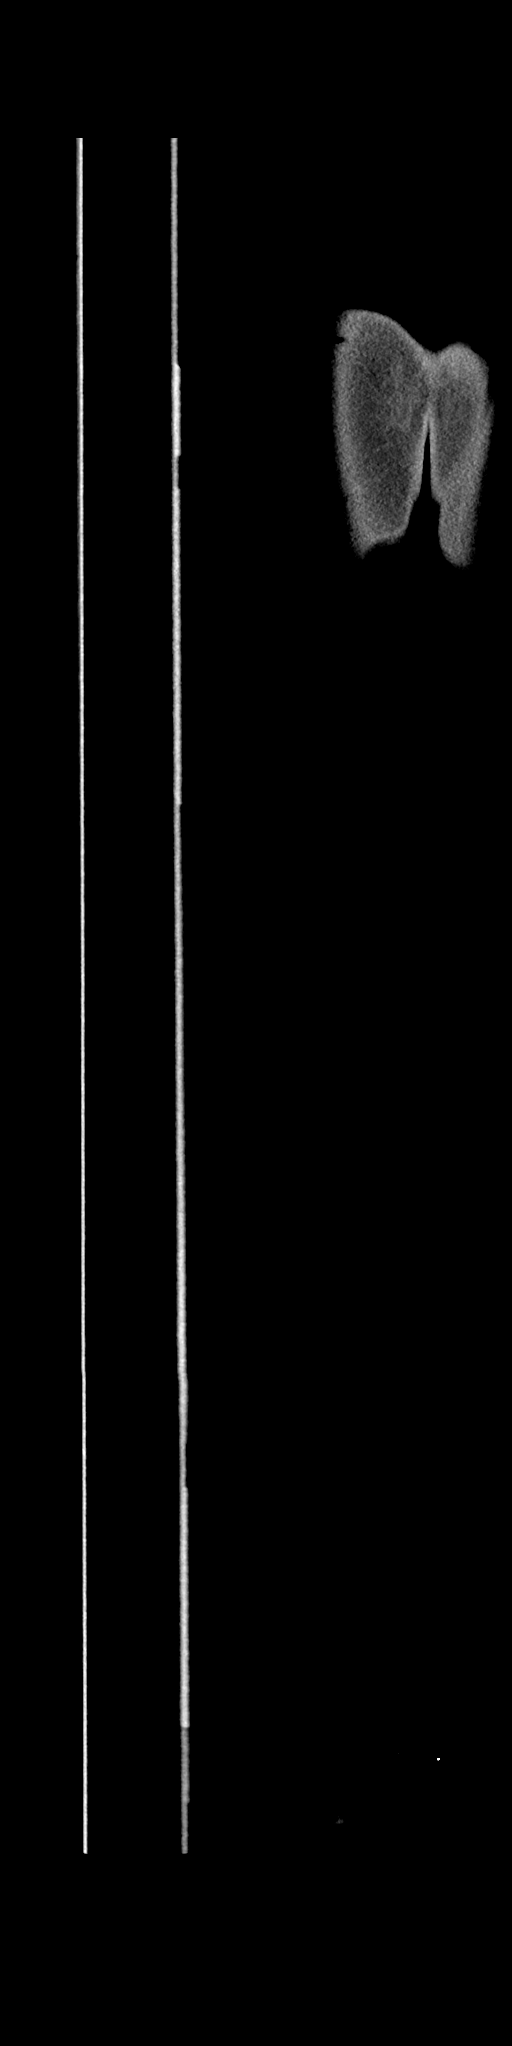

[9 of 46 positions shown; findings below may reference images not displayed]

FINDINGS: Vascular: Visualized distal abdominal aorta shows no aneurysmal
dilatation. The common, internal and external iliac arteries on the
right are within normal limits. Common femoral artery and femoral
bifurcation are widely patent.

Right superficial femoral artery is widely patent without focal
hemodynamically significant stenosis. The popliteal artery and
popliteal trifurcation are patent as well and within normal limits.
Three-vessel runoff to the right ankle is seen.

Nonvascular: Anterior to and separate from the right popliteal
artery best seen on image number 205 of series 4, there is a small 9
x 7 mm mildly enhancing soft tissue lesion identified. This was
present on the prior MRI and measured 6 x 4 mm at that time. It
demonstrated enhancement on the prior MRI and is felt to represent a
small reactive lymph node. A few similar structures are noted
lateral to the artery and vein in the distal thigh best seen on
images 197 and 202 of series 4.

Bony structures are within normal limits with the exception of lytic
areas with increased sclerosis within the proximal tibial shaft
similar to that seen on prior MRI examination. Prior MRI suggested
that these represent chronic osteomyelitis. By history the patient
is scheduled for subsequent bone biopsy in the near future.

Review of the MIP images confirms the above findings.
IMPRESSION: No arterial abnormality is identified.

There are mildly enhancing soft tissue structures identified along
the anterior aspect of the right popliteal artery as well as lateral
to the artery and vein slightly more superiorly felt to represent
reactive adenopathy. These were present on the prior MRI examination
and demonstrates some slight increase in size consistent with a
reactive nature. This corresponds with the inflammatory appearing
changes in the proximal tibia.

Changes in the right tibia as previously described on MRI suspicious
for subacute/chronic osteomyelitis. Patient has scheduled biopsy
upcoming.

## 2022-11-26 ENCOUNTER — Encounter: Payer: Self-pay | Admitting: *Deleted
